# Patient Record
Sex: Male | Born: 1990 | Hispanic: No | Marital: Single | Smoking: Never smoker
Health system: Southern US, Community
[De-identification: ages and names within clinical notes are randomized; demographics above are authoritative.]

---

## 2008-09-04 ENCOUNTER — Ambulatory Visit: Payer: Self-pay | Admitting: Sports Medicine

## 2008-10-29 ENCOUNTER — Ambulatory Visit: Payer: Self-pay | Admitting: Sports Medicine

## 2008-10-29 DIAGNOSIS — M25569 Pain in unspecified knee: Secondary | ICD-10-CM

## 2008-10-29 DIAGNOSIS — M7650 Patellar tendinitis, unspecified knee: Secondary | ICD-10-CM

## 2008-11-27 ENCOUNTER — Ambulatory Visit: Payer: Self-pay | Admitting: Sports Medicine

## 2009-01-02 ENCOUNTER — Ambulatory Visit: Payer: Self-pay | Admitting: Family Medicine

## 2009-01-28 ENCOUNTER — Encounter: Payer: Self-pay | Admitting: Sports Medicine

## 2009-09-08 ENCOUNTER — Ambulatory Visit: Payer: Self-pay | Admitting: Sports Medicine

## 2009-09-08 DIAGNOSIS — M549 Dorsalgia, unspecified: Secondary | ICD-10-CM | POA: Insufficient documentation

## 2009-09-08 DIAGNOSIS — M25579 Pain in unspecified ankle and joints of unspecified foot: Secondary | ICD-10-CM

## 2009-12-02 ENCOUNTER — Ambulatory Visit: Payer: Self-pay | Admitting: Sports Medicine

## 2010-11-23 NOTE — Assessment & Plan Note (Signed)
Summary: 11:30 APPT,R PATELLA TENDON PAIN,MC   Vital Signs:  Patient profile:   20 year old male BP sitting:   105 / 71  Vitals Entered By: Lillia Pauls CMA (December 02, 2009 12:20 PM)   Complete Medication List: 1)  Lidoderm 5 % Ptch (Lidocaine) .... Use 1 patch as directed for 12 hours only each day Prescriptions: LIDODERM 5 % PTCH (LIDOCAINE) use 1 patch as directed for 12 hours only each day  #30 x 1   Entered and Authorized by:   Enid Baas MD   Signed by:   Enid Baas MD on 12/02/2009   Method used:   Electronically to        [facility]       [address]       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 that required surgical repair by Dr Mack Guise he did extensive rehab and worked through summer and fall Played first half of this season but sionce has had increasing anterior medial knee pain maybe mild swelling at times but no clear effussion no locking no giving out no patellar subluxation  when painful very hard to jump, bend or run after recent game was painful enough that he felt he limped for 24 hours but by 48 hours much improved  evaluated at [facility] by Drs Donalee Citrin nad Zachery Dauer encouraged on core strength and other work but during season too much rehab would make him sore trial on knee sleeve - did not like  note last week given Etodolac two times a day by Dr Zachery Dauer and that has cut down pain considerably  Dr Mack Guise reports that at time of surgery there was cartilage loss in medial retropatellar cartilage  [name] points to specific area on med patella that catches and becomes very painful    Physical Exam  General:  Well-developed,well-nourished,in no acute distress;  alert,appropriate and cooperative throughout examination Msk:  RT knee exam shows no effusion; stable ligaments; negative Mcmurray's and provocative meniscal tests; non painful patellar compression; patellar and quadriceps tendons unremarkable mid line patellar tendon scar persitent creptiation on med patellar compression this area is tender to palpation and clicks with patellar tracking  note core strength testing - hip, pelvis  and quad strength testing to me was very good today  step downs good but some loss of patellar tracking control  Additional Exam:  MSK [place] Patellar tendon show good pattern of helaing post op there is now tissue in the area of previous defect tendon is thickend but less than on prior exams no neovessels noted this time  quad tendon looks good no effusion or fluid in suprapatellar pouch mensici are intact at edges of joint line  medial patellar edge appears irregular with some minor fragments of cartilage no loose bodies seen  images saved   Impression & Recommendations:  Problem # 1:  KNEE PAIN (ICD-719.46)  I think his sxs are retropatellar  my suspicion is that this is more about having developed an abnormal trackijg pattern than the degree of cartialge injury 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 but I think the key approach is  to push quad hypertrophy in off season really train him for eccentric quad contraction - eg plantar flexed short arc squats, etc and dynamic patellar motion with step exercises with varying angles and weight try to see if by changing dynamics of quad fxn we can lessen his abnormal tracking and resolve his sxs  in short term: cont etodolac two times a day try lidoderm patches for 12 hours per day dor at least 1 month and see if this lessens pain response iontophoresis is worth trying with dexamethasone or similar I think he could play if no swelling or mechanical sxs during last few games and tourney  Orders: US  EXTREMITY NON-VASC REAL-TIME IMG (19147)  Problem # 2:  PATELLAR TENDINITIS (ICD-726.64) Assessment: Improved  even thought his was repaired quad size is now within 1 cm of opposite leg there is no swelling around tendon there is no neovessel activiyt  this looks good and not source of his pain  Orders: US EXTREMITY NON-VASC REAL-TIME IMG ([id number])  Complete Medication List: 1)  Lidoderm 5 % Ptch (Lidocaine) .... Use 1 patch as directed for 12 hours only each day  ]

## 2010-12-21 ENCOUNTER — Ambulatory Visit: Payer: Self-pay | Admitting: Family Medicine

## 2011-01-07 ENCOUNTER — Ambulatory Visit: Payer: Self-pay | Admitting: Family Medicine

## 2011-01-18 ENCOUNTER — Ambulatory Visit: Payer: Self-pay | Admitting: Family Medicine

## 2011-01-20 ENCOUNTER — Ambulatory Visit: Payer: Self-pay | Admitting: Family Medicine

## 2011-02-03 ENCOUNTER — Ambulatory Visit: Payer: Self-pay | Admitting: Family Medicine

## 2011-02-04 ENCOUNTER — Ambulatory Visit: Payer: Self-pay | Admitting: Family Medicine

## 2011-02-04 ENCOUNTER — Emergency Department: Payer: Self-pay | Admitting: Emergency Medicine

## 2011-02-07 ENCOUNTER — Ambulatory Visit: Payer: Self-pay | Admitting: Family Medicine

## 2011-11-18 ENCOUNTER — Ambulatory Visit: Payer: Self-pay | Admitting: Family Medicine

## 2013-02-20 ENCOUNTER — Ambulatory Visit: Payer: Self-pay | Admitting: Family Medicine

## 2013-03-19 IMAGING — CT CT ABD-PELV W/ CM
1 of 2 series · 15 of 32 positions shown, 19 images · non-contrast
Comparison: none

REASON FOR EXAM: CR [id number] LLQ abdomen pain
COMMENTS:

PROCEDURE:     CT  - CT ABDOMEN / PELVIS  W  - February 03, 2011 [DATE]
RESULT:
TECHNIQUE: Helical 3 mm sections were obtained from the lung bases through
the pubic symphysis status post intravenous administration of 100 ml of
Ksovue-3ML and oral contrast.

[Series 2: 3mm soft tissue · axial · 0.76mm/px · z∈[+134,+580]mm · 15 of 163 slices shown, 19 images]
[im 7/163  soft-tissue]
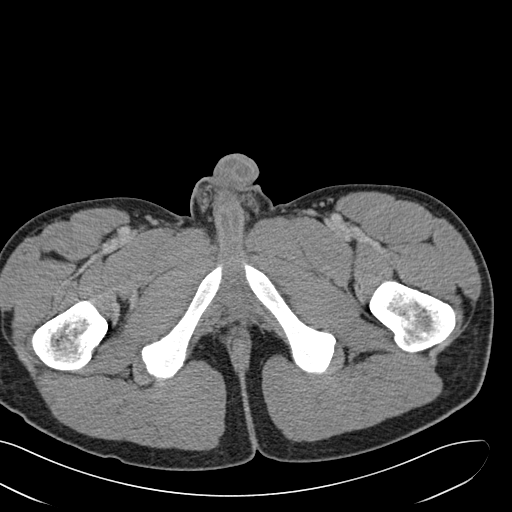
[im 7/163  bone]
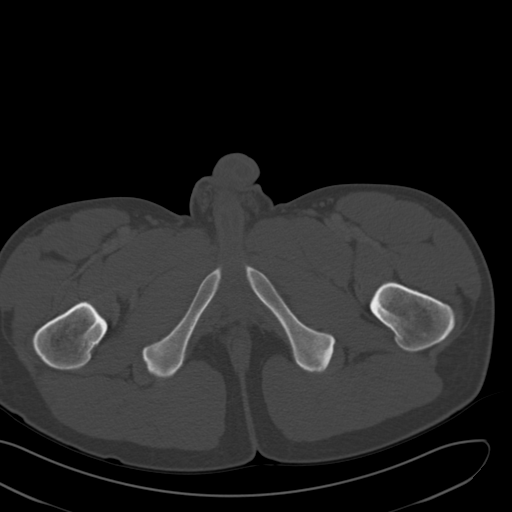
[im 20/163  soft-tissue]
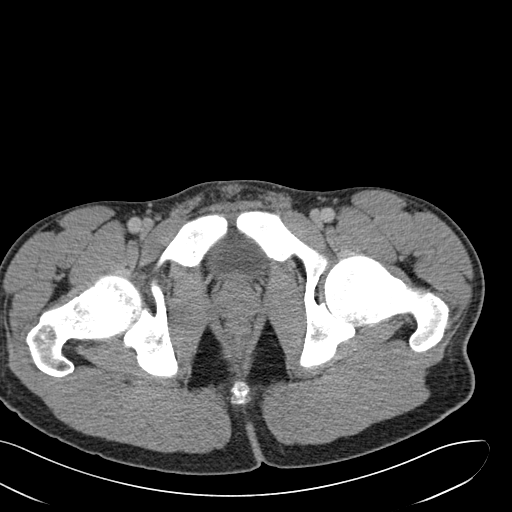
[im 33/163  soft-tissue]
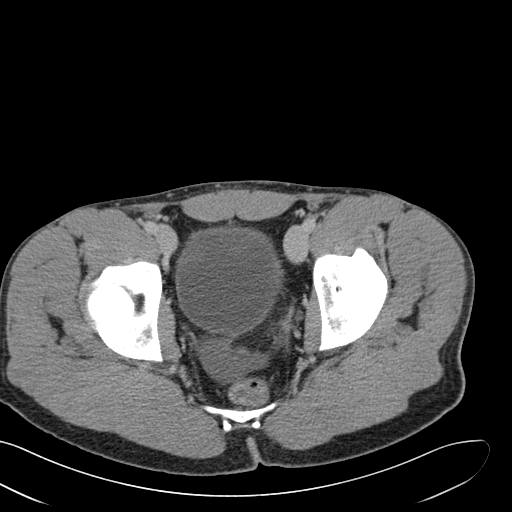
[im 46/163  soft-tissue]
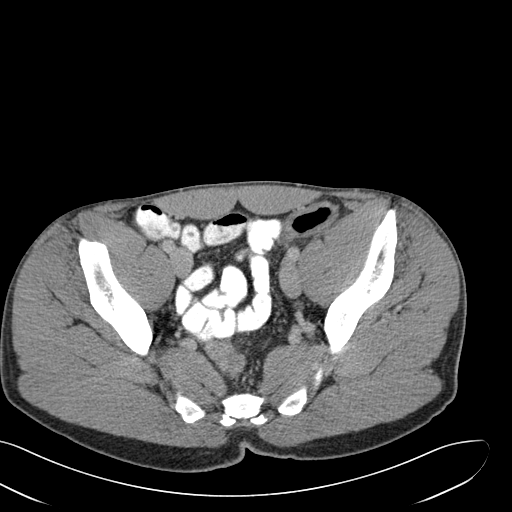
[im 59/163  soft-tissue]
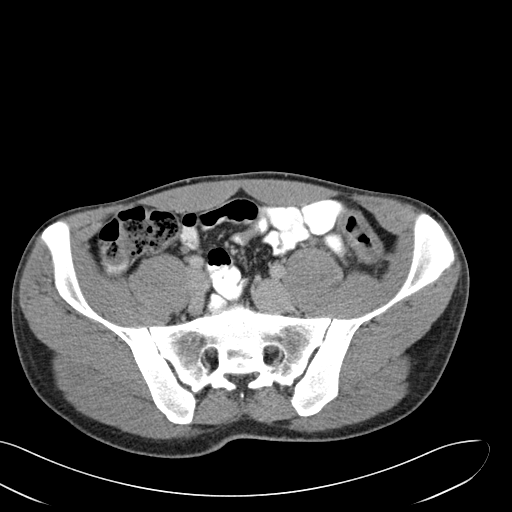
[im 72/163  soft-tissue]
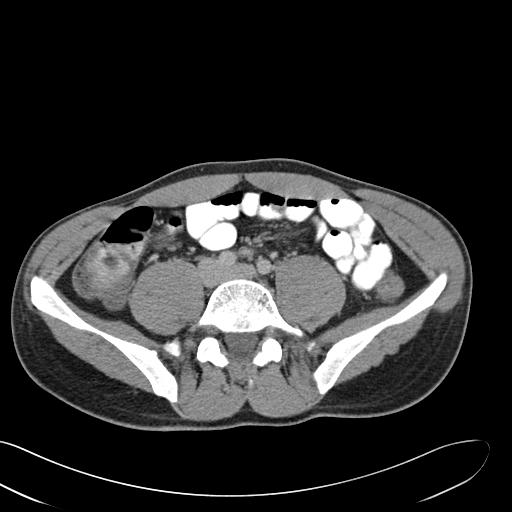
[im 85/163  soft-tissue]
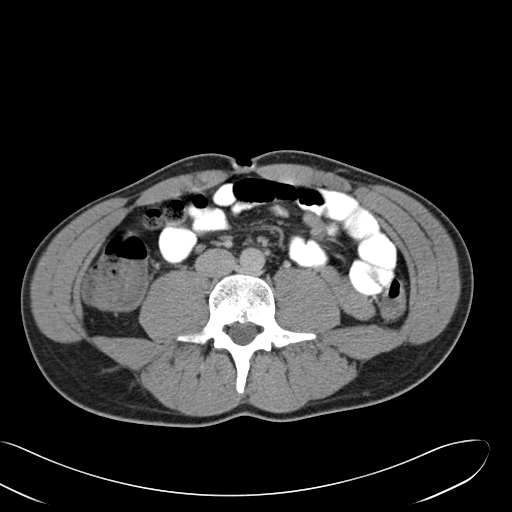
[im 91/163  soft-tissue]
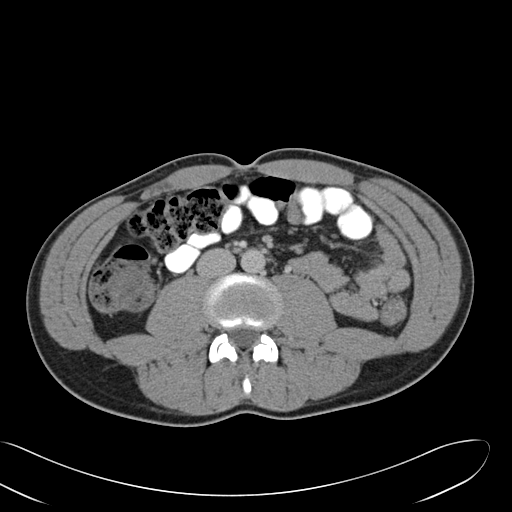
[im 104/163  soft-tissue]
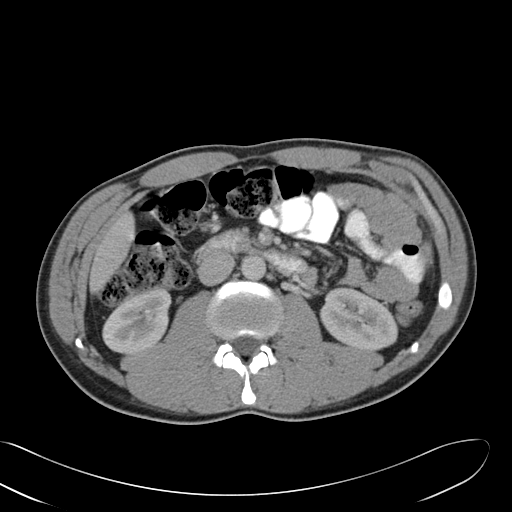
[im 104/163  bone]
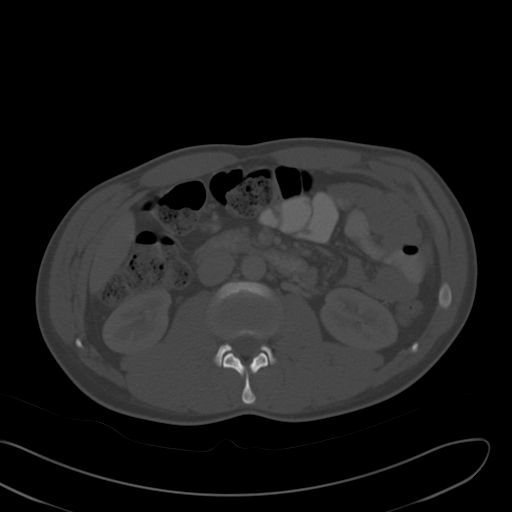
[im 117/163  soft-tissue]
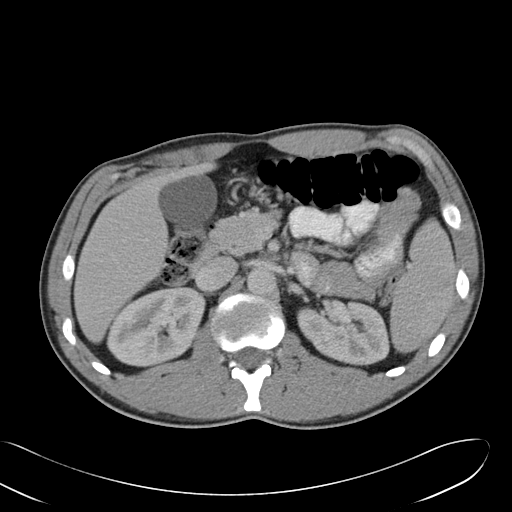
[im 130/163  soft-tissue]
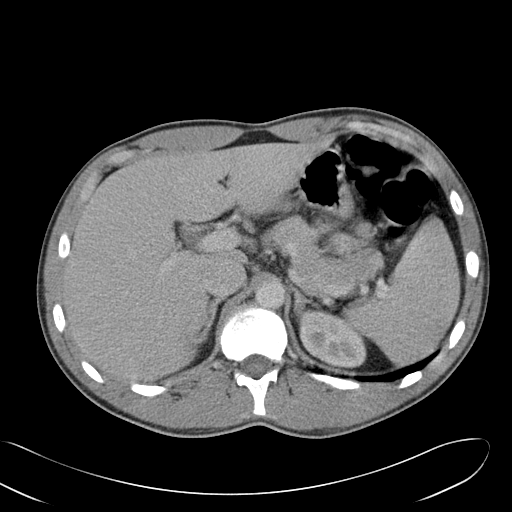
[im 137/163  lung]
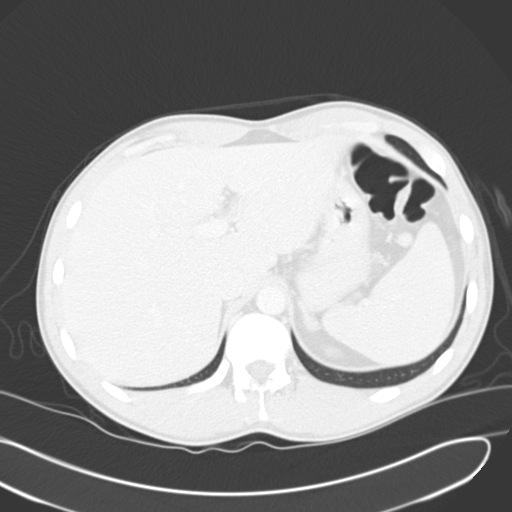
[im 143/163  soft-tissue]
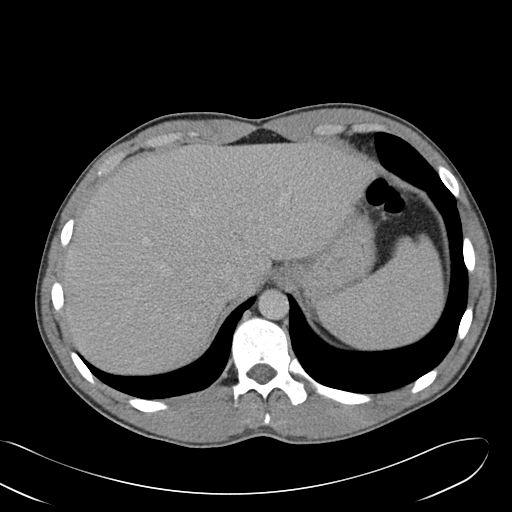
[im 143/163  lung]
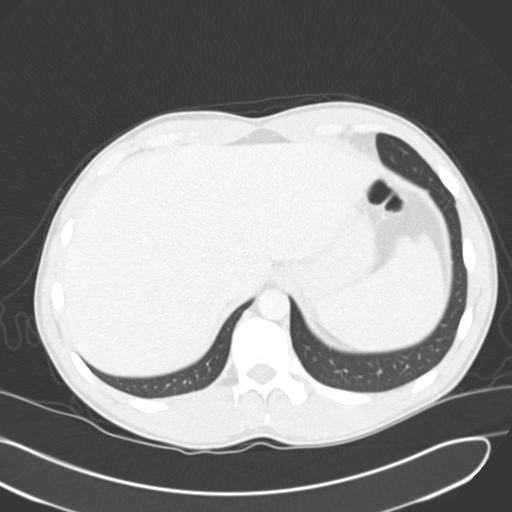
[im 150/163  lung]
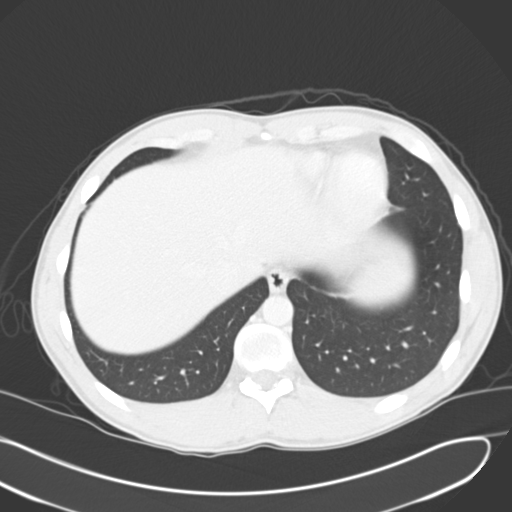
[im 156/163  soft-tissue]
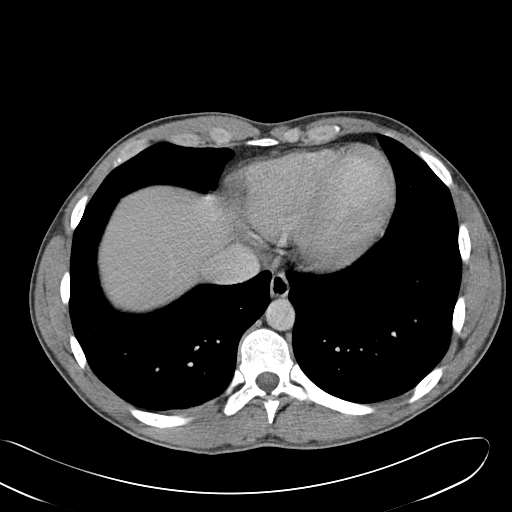
[im 156/163  lung]
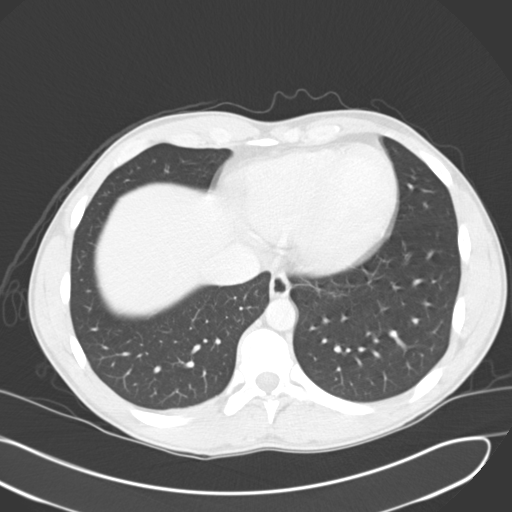

[15 of 32 positions shown; findings below may reference images not displayed]

FINDINGS: The lung bases are unremarkable.

The liver, spleen, adrenals, pancreas, and kidneys are unremarkable. There
is diffuse colonic mural thickening involving the descending colon. Mild
induration is appreciated within the surrounding mesenteric fat. There is no
evidence of associated free fluid or loculated fluid collections. There is
no evidence of free air. There is no evidence of an abdominal aortic
aneurysm. The celiac, SMA, IMA, portal vein, SMV and IMV are opacified.
There is no evidence of abdominal or pelvic loculated fluid collections,
masses or adenopathy. A small amount of free fluid is appreciated within the
pelvis. There is evidence of small subcentimeter mesenteric lymph nodes and
likely reactive.
IMPRESSION: 1.  Diffuse colitis involving the descending colon and differential
considerations are infectious versus inflammatory. There is no evidence of
associated loculated fluid collections to suggest abscess.
2.  Free fluid is appreciated within the pelvis.
3.  Dr. Hinostroza was informed of these findings via a preliminary faxed report
dated 02/03/2011 at [DATE], EST.

## 2017-02-09 ENCOUNTER — Other Ambulatory Visit: Payer: Self-pay | Admitting: Sports Medicine

## 2017-02-09 DIAGNOSIS — G8929 Other chronic pain: Secondary | ICD-10-CM

## 2017-02-09 DIAGNOSIS — M25561 Pain in right knee: Principal | ICD-10-CM

## 2017-02-09 DIAGNOSIS — M7651 Patellar tendinitis, right knee: Secondary | ICD-10-CM

## 2017-02-21 ENCOUNTER — Ambulatory Visit: Payer: Self-pay

## 2017-02-27 ENCOUNTER — Ambulatory Visit (INDEPENDENT_AMBULATORY_CARE_PROVIDER_SITE_OTHER): Payer: BLUE CROSS/BLUE SHIELD | Admitting: Family Medicine

## 2017-02-27 ENCOUNTER — Encounter: Payer: Self-pay | Admitting: Family Medicine

## 2017-02-27 DIAGNOSIS — M7661 Achilles tendinitis, right leg: Secondary | ICD-10-CM | POA: Diagnosis not present

## 2017-02-27 DIAGNOSIS — M7662 Achilles tendinitis, left leg: Secondary | ICD-10-CM

## 2017-02-27 NOTE — Patient Instructions (Signed)
Your ultrasound is very reassuring. We will take a more conservative approach to your achilles tendinopathy given it is medication related and at increased risk of rupture. Heel lifts - wear at all times when up and walking around. Avoid flat shoes, barefoot walking, inclines, stairs as much as you can. Icing 15 minutes at a time 3-4 times a day. Aleve 2 tabs twice a day with food for 7-10 days then as needed for pain and inflammation. Consider using crutches to help support your weight over the next couple weeks as well. Follow up with me in 2 weeks - only upper body workouts during this time because of the risk to the achilles tendons.

## 2017-02-28 DIAGNOSIS — M7660 Achilles tendinitis, unspecified leg: Secondary | ICD-10-CM | POA: Insufficient documentation

## 2017-02-28 NOTE — Assessment & Plan Note (Signed)
2/2 levaquin.  Ultrasound is reassuring without evidence of tearing.  Stressed importance of conservative approach, risk of rupture.  Start with heel lifts.  Avoid flat shoes, barefoot walking, inclines, stairs as much as possible.  Consider crutches to help support as well.  Icing 15 minutes at a time 3-4 times a day.  Aleve twice a day.  F/u in 2 weeks for reevaluation.

## 2017-02-28 NOTE — Progress Notes (Signed)
PCP: Eartha InchBadger, Michael C, MD  Subjective:   HPI: Patient is a 26 y.o. male here for bilateral achilles pain.  Patient reports he was placed on levaquin starting 4/20. He took this for three days then stopped this. Initially had some calf soreness that progressed to include pain up the calf and into achilles. Has been using heel lifts. Noticed localized swelling on right posterior calcaneus, some discoloration on left over calcaneus. Pain level is 0/10 at rest, feels a soreness though. Usually is biking, running, swimming but has been resting. Has been icing, elevating, taking advil or aleve. No numbness.  No past medical history on file.  No current outpatient prescriptions on file prior to visit.   No current facility-administered medications on file prior to visit.     No past surgical history on file.  Allergies  Allergen Reactions  . Levaquin [Levofloxacin In D5w]     Social History   Social History  . Marital status: Single    Spouse name: N/A  . Number of children: N/A  . Years of education: N/A   Occupational History  . Not on file.   Social History Main Topics  . Smoking status: Never Smoker  . Smokeless tobacco: Never Used  . Alcohol use Not on file  . Drug use: Unknown  . Sexual activity: Not on file   Other Topics Concern  . Not on file   Social History Narrative  . No narrative on file    No family history on file.  BP 107/73   Pulse 82   Ht 6\' 8"  (2.032 m)   Wt 210 lb (95.3 kg)   BMI 23.07 kg/m   Review of Systems: See HPI above.     Objective:  Physical Exam:  Gen: NAD, comfortable in exam room  Bilateral lower legs: Small haglunds deformity on right, some discoloration in same area on left.  No other deformity.  No muscular defect, other swelling or bruising. TTP mildly bilateral achilles tendons, left medial gastroc. FROM ankles and knees. Did not assess calf raise or strength. NVI distally.  MSK u/s:  Bilateral achilles  tendons intact but left side 0.82cm with trace retrocalcaneal bursitis.  Right achilles thickness 0.52cm without bursitis.  No neovascularity or partial tearing of tendons.  No other abnormalities noted of achilles or gastrocs.   Assessment & Plan:  1. 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  Ultrasound is reassuring without evidence of tearing.  Stressed importance of conservative approach, risk of rupture.  Start with heel lifts.  Avoid flat shoes, barefoot walking, inclines, stairs as much as possible.  Consider crutches to help support as well.  Icing 15 minutes at a time 3-4 times a day.  Aleve twice a day.  F/u in 2 weeks for reevaluation.

## 2017-03-13 ENCOUNTER — Ambulatory Visit: Payer: BLUE CROSS/BLUE SHIELD | Admitting: Family Medicine
